# Patient Record
Sex: Male | Born: 1980 | Race: White | Hispanic: No | Marital: Single | Smoking: Never smoker
Health system: Southern US, Community
[De-identification: ages and names within clinical notes are randomized; demographics above are authoritative.]

---

## 2006-03-24 ENCOUNTER — Encounter: Admission: RE | Admit: 2006-03-24 | Discharge: 2006-03-24 | Payer: Self-pay | Admitting: Emergency Medicine

## 2011-10-10 ENCOUNTER — Ambulatory Visit: Payer: PRIVATE HEALTH INSURANCE

## 2011-10-10 ENCOUNTER — Encounter: Payer: Self-pay | Admitting: Physician Assistant

## 2011-10-11 ENCOUNTER — Ambulatory Visit (INDEPENDENT_AMBULATORY_CARE_PROVIDER_SITE_OTHER): Payer: PRIVATE HEALTH INSURANCE | Admitting: Emergency Medicine

## 2011-10-11 VITALS — BP 122/68 | HR 99 | Temp 98.2°F | Resp 16 | Ht 68.78 in | Wt 199.6 lb

## 2011-10-11 DIAGNOSIS — Z202 Contact with and (suspected) exposure to infections with a predominantly sexual mode of transmission: Secondary | ICD-10-CM

## 2011-10-11 DIAGNOSIS — G4700 Insomnia, unspecified: Secondary | ICD-10-CM

## 2011-10-11 DIAGNOSIS — Z7251 High risk heterosexual behavior: Secondary | ICD-10-CM

## 2011-10-11 MED ORDER — TEMAZEPAM 30 MG PO CAPS: 30.0000 mg | ORAL_CAPSULE | Freq: Every evening | ORAL | Status: DC | PRN

## 2011-10-11 NOTE — Progress Notes (Signed)
  Subjective:    Patient ID: Julian Mccarthy, male    DOB: [birth year], 30 y.o.   MRN: [id number]  HPI Ex fiancee under treatment for herpes.  Requests a work up to ensure that he is not infected as she was not faithful.  Denies being symptomatic    Review of Systems As per HPI, otherwise negative.      Objective:   Physical Exam  GEN: WDWN, NAD, Non-toxic, Alert & Oriented x 3 HEENT: Atraumatic, Normocephalic.  Ears and Nose: No external deformity. EXTR: No clubbing/cyanosis/edema NEURO: Normal gait.  PSYCH: Normally interactive. Conversant. Not depressed or anxious appearing.  Calm demeanor.  Genitalia:  Normal male       Assessment & Plan:  Insomnia Exposure to std Follow up as needed

## 2011-10-12 ENCOUNTER — Encounter: Payer: Self-pay | Admitting: Emergency Medicine

## 2011-10-12 LAB — HSV(HERPES SIMPLEX VRS) I + II AB-IGG
HSV 1 Glycoprotein G Ab, IgG: 10.36 IV — ABNORMAL HIGH
HSV 2 Glycoprotein G Ab, IgG: 0.11 IV

## 2011-10-12 LAB — GC/CHLAMYDIA PROBE AMP, URINE
Chlamydia, Swab/Urine, PCR: NEGATIVE
GC Probe Amp, Urine: NEGATIVE

## 2011-10-12 LAB — RPR

## 2011-10-15 ENCOUNTER — Telehealth: Payer: Self-pay

## 2011-10-15 NOTE — Telephone Encounter (Signed)
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 He would like more information about it and about treatment.  Best [phone] (he gave me this number-in the computer it is [phone] I don't know which is correct)

## 2011-10-15 NOTE — Telephone Encounter (Signed)
Spoke to patient. Printed off handout from [place] and we discussed it. I will mail him the handout. He was satisfied with the answers.

## 2011-10-20 ENCOUNTER — Encounter: Payer: Self-pay | Admitting: Family Medicine

## 2012-05-14 ENCOUNTER — Ambulatory Visit: Payer: Self-pay | Admitting: Physician Assistant

## 2012-05-14 VITALS — BP 130/80 | HR 60 | Temp 98.8°F | Resp 16 | Ht 69.5 in | Wt 194.8 lb

## 2012-05-14 DIAGNOSIS — N489 Disorder of penis, unspecified: Secondary | ICD-10-CM

## 2012-05-14 DIAGNOSIS — J019 Acute sinusitis, unspecified: Secondary | ICD-10-CM

## 2012-05-14 DIAGNOSIS — J310 Chronic rhinitis: Secondary | ICD-10-CM

## 2012-05-14 DIAGNOSIS — R05 Cough: Secondary | ICD-10-CM

## 2012-05-14 DIAGNOSIS — T485X5A Adverse effect of other anti-common-cold drugs, initial encounter: Secondary | ICD-10-CM

## 2012-05-14 DIAGNOSIS — R21 Rash and other nonspecific skin eruption: Secondary | ICD-10-CM

## 2012-05-14 LAB — POCT URINALYSIS DIPSTICK
Bilirubin, UA: NEGATIVE
Glucose, UA: NEGATIVE
Ketones, UA: NEGATIVE
Leukocytes, UA: NEGATIVE
Nitrite, UA: NEGATIVE
Urobilinogen, UA: 0.2

## 2012-05-14 LAB — POCT UA - MICROSCOPIC ONLY
Bacteria, U Microscopic: NEGATIVE
Casts, Ur, LPF, POC: NEGATIVE
Crystals, Ur, HPF, POC: NEGATIVE
RBC, urine, microscopic: NEGATIVE

## 2012-05-14 MED ORDER — ALBUTEROL SULFATE HFA 108 (90 BASE) MCG/ACT IN AERS: 2.0000 | INHALATION_SPRAY | RESPIRATORY_TRACT | Status: DC | PRN

## 2012-05-14 MED ORDER — HYDROCODONE-HOMATROPINE 5-1.5 MG/5ML PO SYRP: ORAL_SOLUTION | ORAL | Status: DC

## 2012-05-14 MED ORDER — LEVOFLOXACIN 500 MG PO TABS: 500.0000 mg | ORAL_TABLET | Freq: Every day | ORAL | Status: DC

## 2012-05-14 NOTE — Progress Notes (Signed)
Patient ID: SOLLIE VULTAGGIO MRN: [id number], DOB: 1980/12/06, 32 y.o. Date of Encounter: 05/14/2012, 8:24 PM  Primary Physician: No primary provider on file.  Chief Complaint: Sinusitis and STD check  HPI: 32 y.o. year old male with history below presents with two issues.  1) Patient presents with 2 week history of worsening nasal congestion, sinus pressure, post nasal drip, and one day history of cough. Subjective fever, chills, and myalgias the previous night and today. Cough began the previous night and has been nonproductive. He feels like his chest is tight. No chest pain, wheezing, dyspnea, or shortness of breath. Now with pain over his upper teeth. Normal appetite. Has been using a [employer] and Afrin daily to twice daily for the past week. States that he does not tolerate steroids.   2) He also requests an STD check. Intercourse with women. 3 new partners within the past 12 months. Used condoms for all but the past partner. Reports that his last partner told him she did not have any STD's. He has history of HSV 1. No known lesions. He reports having "a pimple" on the underside of his penis for a month or two without change. No dysuria, frequency, urgency, or discharge.     History reviewed. No pertinent past medical history.   Home Meds: Prior to Admission medications   Medication Sig Start Date End Date Taking? Authorizing Provider  Multiple Vitamin (MULTIVITAMIN) tablet Take 1 tablet by mouth daily.   Yes Historical Provider, MD  oxymetazoline (AFRIN) 0.05 % nasal spray Place 2 sprays into the nose 2 (two) times daily.   Yes Historical Provider, MD  temazepam (RESTORIL) 30 MG capsule Take 1 capsule (30 mg total) by mouth at bedtime as needed for sleep. 10/11/11 11/10/11  Phillips Odor, MD    Allergies:  Allergies  Allergen Reactions  . Flonase (Fluticasone Propionate) Other (See Comments)    History   Social History  . Marital Status: Single    Spouse Name: N/A   Number of Children: N/A  . Years of Education: N/A   Occupational History  . Not on file.   Social History Main Topics  . Smoking status: Never Smoker   . Smokeless tobacco: Not on file  . Alcohol Use: Not on file  . Drug Use: Not on file  . Sexually Active: Not on file   Other Topics Concern  . Not on file   Social History Narrative  . No narrative on file     Review of Systems: Constitutional: positive for fever, chills, myalgias, and fatigue. negative for night sweats, or weight changes  HEENT: see above Cardiovascular: negative for chest pain, orthopnea, DOE, or palpitations Respiratory: positive for cough. negative for hemoptysis, wheezing, or shortness of breath Abdominal: negative for abdominal pain, nausea, vomiting, diarrhea, or constipation Genitourinary: see above Dermatological: negative for rash Neurologic: negative for headache, dizziness, or syncope   Physical Exam: Blood pressure 130/80, pulse 60, temperature 98.8 F (37.1 C), temperature source Oral, resp. rate 16, height 5' 9.5" (1.765 m), weight 194 lb 12.8 oz (88.361 kg), SpO2 98.00%., Body mass index is 28.36 kg/(m^2). General: Well developed, well nourished, in no acute distress. Head: Normocephalic, atraumatic, eyes without discharge, sclera non-icteric, nares are without discharge. Bilateral auditory canals clear, TM's are without perforation, pearly grey and translucent with reflective cone of light bilaterally. Palpation of the maxillary sinuses relieves pressure bilaterally. Oral cavity moist, posterior pharynx with post nasal drip. No exudate, erythema, or peritonsillar abscess. Uvula  midline.   Neck: Supple. No thyromegaly. Full ROM. No lymphadenopathy. Lungs: Clear bilaterally to auscultation without wheezes, rales, or rhonchi. Breathing is unlabored. Heart: RRR with S1 S2. No murmurs, rubs, or gallops appreciated. Abdomen: Soft, non-tender, non-distended with normoactive bowel sounds. No  hepatosplenomegaly. No rebound/guarding. No obvious abdominal masses. Genitourinary: No inguinal hernia bilaterally. No testicular pain, mass, or epididymal TTP. Pinpoint hypopigmented lesion along the dorsal base of the carina. This does not resemble a herpetic lesion or a vesicular lesion. Has the appearance of a mildly calcified folliculitis.   Msk:  Strength and tone normal for age. Extremities/Skin: Warm and dry. No clubbing or cyanosis. No edema. No rashes or suspicious lesions. Neuro: Alert and oriented X 3. Moves all extremities spontaneously. Gait is normal. CNII-XII grossly in tact. Psych:  Responds to questions appropriately with a normal affect.   Labs: Results for orders placed in visit on 05/14/12  POCT UA - MICROSCOPIC ONLY      Result Value Range   WBC, Ur, HPF, POC neg     RBC, urine, microscopic neg     Bacteria, U Microscopic neg     Mucus, UA neg     Epithelial cells, urine per micros neg     Crystals, Ur, HPF, POC neg     Casts, Ur, LPF, POC neg     Yeast, UA neg    POCT URINALYSIS DIPSTICK      Result Value Range   Color, UA yellow     Clarity, UA clear     Glucose, UA neg     Bilirubin, UA neg     Ketones, UA neg     Spec Grav, UA 1.010     Blood, UA neg     pH, UA 7.0     Protein, UA neg     Urobilinogen, UA 0.2     Nitrite, UA neg     Leukocytes, UA Negative      HIV, RPR, HSV 2, Uriprobe all pending  ASSESSMENT AND PLAN:  32 y.o. male with sinusitis and here for STD check  1) Sinusitis/cough/rhinitis medicamentosa  -Levaquin 500 mg 1 po daily #10 no RF -Proventil 2 puffs inhaled q 4-6 hours prn #1 no RF -Hycodan #4oz 1 tsp po q 4-6 hours prn cough no RF SED -Taper Afrin usage -Mucinex -Rest/fluids  2) STD check -Await labs -Safe sex practices   Elinor Dodge, Cordelia Poche 05/14/2012 8:24 PM

## 2012-05-15 ENCOUNTER — Telehealth: Payer: Self-pay

## 2012-05-15 DIAGNOSIS — R05 Cough: Secondary | ICD-10-CM

## 2012-05-15 DIAGNOSIS — R059 Cough, unspecified: Secondary | ICD-10-CM

## 2012-05-15 DIAGNOSIS — J019 Acute sinusitis, unspecified: Secondary | ICD-10-CM

## 2012-05-15 NOTE — Telephone Encounter (Signed)
Can you advise on a more cost effective antibiotic?

## 2012-05-15 NOTE — Telephone Encounter (Signed)
levofloxacin (LEVAQUIN) 500 MG tablet albuterol (PROVENTIL HFA;VENTOLIN HFA) 108 (90 BASE) MCG/ACT inhaler  Patient does not have insurance and the meds are too expensive Please send to [facility] on [address] Do you have coupons?     [phone]

## 2012-05-16 ENCOUNTER — Other Ambulatory Visit: Payer: Self-pay

## 2012-05-16 DIAGNOSIS — R21 Rash and other nonspecific skin eruption: Secondary | ICD-10-CM

## 2012-05-16 DIAGNOSIS — J019 Acute sinusitis, unspecified: Secondary | ICD-10-CM

## 2012-05-16 LAB — GC/CHLAMYDIA PROBE AMP, URINE
Chlamydia, Swab/Urine, PCR: NEGATIVE
GC Probe Amp, Urine: NEGATIVE

## 2012-05-16 MED ORDER — LEVOFLOXACIN 500 MG PO TABS: 500.0000 mg | ORAL_TABLET | Freq: Every day | ORAL | Status: DC

## 2012-05-16 MED ORDER — ALBUTEROL SULFATE HFA 108 (90 BASE) MCG/ACT IN AERS: 2.0000 | INHALATION_SPRAY | RESPIRATORY_TRACT | Status: DC | PRN

## 2012-05-16 MED ORDER — AMOXICILLIN-POT CLAVULANATE 875-125 MG PO TABS: 1.0000 | ORAL_TABLET | Freq: Two times a day (BID) | ORAL | Status: DC

## 2012-05-16 NOTE — Telephone Encounter (Signed)
Please call patient. Medication changed.

## 2012-05-17 NOTE — Telephone Encounter (Signed)
Thank you patient has been advised.

## 2013-11-28 ENCOUNTER — Ambulatory Visit (INDEPENDENT_AMBULATORY_CARE_PROVIDER_SITE_OTHER): Payer: No Typology Code available for payment source

## 2013-11-28 ENCOUNTER — Ambulatory Visit (INDEPENDENT_AMBULATORY_CARE_PROVIDER_SITE_OTHER): Payer: No Typology Code available for payment source | Admitting: Family Medicine

## 2013-11-28 DIAGNOSIS — M25511 Pain in right shoulder: Secondary | ICD-10-CM

## 2013-11-28 DIAGNOSIS — M25519 Pain in unspecified shoulder: Secondary | ICD-10-CM

## 2013-11-28 DIAGNOSIS — R519 Headache, unspecified: Secondary | ICD-10-CM

## 2013-11-28 DIAGNOSIS — M542 Cervicalgia: Secondary | ICD-10-CM

## 2013-11-28 DIAGNOSIS — R209 Unspecified disturbances of skin sensation: Secondary | ICD-10-CM

## 2013-11-28 DIAGNOSIS — R208 Other disturbances of skin sensation: Secondary | ICD-10-CM

## 2013-11-28 DIAGNOSIS — M62838 Other muscle spasm: Secondary | ICD-10-CM

## 2013-11-28 DIAGNOSIS — R51 Headache: Secondary | ICD-10-CM

## 2013-11-28 MED ORDER — CYCLOBENZAPRINE HCL 5 MG PO TABS: ORAL_TABLET | ORAL | Status: DC
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
## 2014-04-30 ENCOUNTER — Ambulatory Visit (INDEPENDENT_AMBULATORY_CARE_PROVIDER_SITE_OTHER): Payer: 59 | Admitting: Family Medicine

## 2014-04-30 VITALS — BP 138/84 | HR 84 | Temp 98.1°F | Resp 16 | Ht 70.0 in | Wt 216.4 lb

## 2014-04-30 DIAGNOSIS — R21 Rash and other nonspecific skin eruption: Secondary | ICD-10-CM

## 2014-04-30 LAB — RPR

## 2014-04-30 LAB — HIV ANTIBODY (ROUTINE TESTING W REFLEX): HIV 1&2 Ab, 4th Generation: NONREACTIVE

## 2014-04-30 NOTE — Progress Notes (Signed)
 @[facility]GO @  This chart was scribed for Julian SidleKurt Lauenstein, MD by Julian Mccarthy, ED Scribe. The patient was seen in room 14. Patient's care was started at 10:54 AM.  Patient ID: Julian Mccarthy MRN: [id number], DOB: 05/21/1980, 34 y.o. Date of Encounter: 04/30/2014, 10:54 AM  Primary Physician: No PCP Per Patient  Chief Complaint  Patient presents with   Tenderness tip of penis    Onset 50month off and on   Would like testing for all STD's   HPI: 34 y.o. year old male with history below presents with irritation and tenderness to the tip of his penis. He is here requesting routine testing for STDs. He states that he had a long term relationship end about 6 months ago, and since then he has had 4 sexual partners, which is abnormal for him. He denies having sexual intercourse for the past two weeks. He denies penile discharge. He works as an Pensions consultantattorney.    No past medical history on file.   Home Meds: Prior to Admission medications   Medication Sig Start Date End Date Taking? Authorizing Provider  Multiple Vitamin (MULTIVITAMIN) tablet Take 1 tablet by mouth daily.    Historical Provider, MD    Allergies:  Allergies  Allergen Reactions   Flonase [Fluticasone Propionate] Other (See Comments)    History   Social History   Marital Status: Single    Spouse Name: N/A   Number of Children: N/A   Years of Education: N/A   Occupational History   Not on file.   Social History Main Topics   Smoking status: Never Smoker    Smokeless tobacco: Never Used   Alcohol Use: Yes     Comment: social   Drug Use: No   Sexual Activity: Not on file   Other Topics Concern   Not on file   Social History Narrative     Review of Systems: Constitutional: negative for chills, fever, night sweats, weight changes, or fatigue  HEENT: negative for vision changes, hearing loss, congestion, rhinorrhea, ST, epistaxis, or sinus pressure Cardiovascular: negative for chest pain or  palpitations Respiratory: negative for hemoptysis, wheezing, shortness of breath, or cough Abdominal: negative for abdominal pain, nausea, vomiting, diarrhea, or constipation Dermatological: negative for rash Neurologic: negative for headache, dizziness, or syncope GU: negative for discharge Skin: rash All other systems reviewed and are otherwise negative with the exception to those above and in the HPI.   Physical Exam: Blood pressure 138/84, pulse 84, temperature 98.1 F (36.7 C), temperature source Oral, resp. rate 16, height 5\' 10"  (1.778 m), weight 216 lb 6.4 oz (98.158 kg), SpO2 98 %., Body mass index is 31.05 kg/(m^2). General: Well developed, well nourished, in no acute distress. Head: Normocephalic, atraumatic, eyes without discharge, sclera non-icteric, nares are without discharge. Bilateral auditory canals clear, TM's are without perforation, pearly grey and translucent with reflective cone of light bilaterally. Oral cavity moist, posterior pharynx without exudate, erythema, peritonsillar abscess, or post nasal drip.  Neck: Supple. No thyromegaly. Full ROM. No lymphadenopathy. Lungs: Clear bilaterally to auscultation without wheezes, rales, or rhonchi. Breathing is unlabored. Heart: RRR with S1 S2. No murmurs, rubs, or gallops appreciated. Abdomen: Soft, non-tender, non-distended with normoactive bowel sounds. No hepatomegaly. No rebound/guarding. No obvious abdominal masses. Msk:  Strength and tone normal for age. Extremities/Skin: Warm and dry. No clubbing or cyanosis. No edema. No rashes or suspicious lesions. Neuro: Alert and oriented X 3. Moves all extremities spontaneously. Gait is normal. CNII-XII grossly in tact. Psych:  Responds to questions appropriately with a normal affect.  Genitalia:  Normal with the exception of very slight erythema at meatus  ASSESSMENT AND PLAN:  34 y.o. year old male with protected heterosexual encounters who has some irritation at the meatus  and wants to be sure this is not an STD This chart was scribed in my presence and reviewed by me personally.    ICD-9-CM ICD-10-CM   1. Rash and nonspecific skin eruption 782.1 R21 GC/Chlamydia Probe Amp     RPR     HSV(herpes simplex vrs) 1+2 ab-IgG     HIV antibody     Signed, Julian Sidle, MD    Signed, Julian Sidle, MD 04/30/2014 10:55 AM

## 2014-05-01 LAB — GC/CHLAMYDIA PROBE AMP
CT Probe RNA: NEGATIVE
GC Probe RNA: NEGATIVE

## 2014-05-01 LAB — HSV(HERPES SIMPLEX VRS) I + II AB-IGG
HSV 1 Glycoprotein G Ab, IgG: 10.27 IV — ABNORMAL HIGH
HSV 2 Glycoprotein G Ab, IgG: <0.1 IV

## 2014-07-03 ENCOUNTER — Ambulatory Visit (INDEPENDENT_AMBULATORY_CARE_PROVIDER_SITE_OTHER): Payer: 59 | Admitting: Family Medicine

## 2014-07-03 ENCOUNTER — Encounter: Payer: Self-pay | Admitting: Physician Assistant

## 2014-07-03 VITALS — BP 130/80 | HR 74 | Temp 98.8°F | Resp 16 | Ht 70.0 in | Wt 216.4 lb

## 2014-07-03 DIAGNOSIS — J0100 Acute maxillary sinusitis, unspecified: Secondary | ICD-10-CM

## 2014-07-03 MED ORDER — AMOXICILLIN-POT CLAVULANATE 875-125 MG PO TABS: 1.0000 | ORAL_TABLET | Freq: Two times a day (BID) | ORAL | Status: DC

## 2014-07-03 NOTE — Patient Instructions (Signed)

## 2014-07-03 NOTE — Progress Notes (Signed)
    Patient ID: Julian Mccarthy MRN: [id number], DOB: 10/17/1980, 34 y.o. Date of Encounter: 07/03/2014, 4:43 PM  Primary Physician: No PCP Per Patient  Chief Complaint:  Chief Complaint  Patient presents with  . pressure    eyes, head, ears x 1 month  . mucus    nasal green "snot"  . Sore Throat    in the am    HPI: 34 y.o. year old male presents with 28 day history of nasal congestion, post nasal drip, sore throat, sinus pressure, and cough. Afebrile. No chills. Nasal congestion thick and green/yellow. Sinus pressure is the worst symptom. Cough is productive secondary to post nasal drip and not associated with time of day. Ears feel full, leading to sensation of muffled hearing. Has tried OTC cold preps without success. No GI complaints.   No recent antibiotics, recent travels, vomiting, or sick contacts   No leg trauma, sedentary periods, h/o cancer, or tobacco use.  No past medical history on file.   Home Meds: Prior to Admission medications   Medication Sig Start Date End Date Taking? Authorizing Provider  Multiple Vitamin (MULTIVITAMIN) tablet Take 1 tablet by mouth daily.    Historical Provider, MD    Allergies:  Allergies  Allergen Reactions  . Flonase [Fluticasone Propionate] Other (See Comments)    History   Social History  . Marital Status: Single    Spouse Name: N/A  . Number of Children: N/A  . Years of Education: N/A   Occupational History  . Not on file.   Social History Main Topics  . Smoking status: Never Smoker   . Smokeless tobacco: Never Used  . Alcohol Use: Yes     Comment: social  . Drug Use: No  . Sexual Activity: Not on file   Other Topics Concern  . Not on file   Social History Narrative     Review of Systems:= Constitutional: negative for chills, fever, night sweats or weight changes Cardiovascular: negative for chest pain or palpitations Respiratory: negative for hemoptysis, wheezing, or shortness of breath Abdominal: negative  for abdominal pain, nausea, vomiting or diarrhea Dermatological: negative for rash Neurologic: negative for headache   Physical Exam:  Blood pressure 130/80, pulse 74, temperature 98.8 F (37.1 C), temperature source Oral, resp. rate 16, height 5\' 10"  (1.778 m), weight 216 lb 6.4 oz (98.158 kg), SpO2 98 %., Body mass index is 31.05 kg/(m^2). General: Well developed, well nourished, in no acute distress. Head: Normocephalic, atraumatic, eyes without discharge, sclera non-icteric, nares are congested. Bilateral auditory canals clear, TM's are without perforation, pearly grey with reflective cone of light bilaterally. Serous effusion bilaterally behind TM's. Maxillary sinus TTP. Oral cavity moist, dentition normal. Posterior pharynx with post nasal drip and mild erythema. No peritonsillar abscess or tonsillar exudate. Neck: Supple. No thyromegaly. Full ROM. No lymphadenopathy. Lungs: Clear bilaterally to auscultation without wheezes, rales, or rhonchi. Breathing is unlabored.  Heart: RRR with S1 S2. No murmurs, rubs, or gallops appreciated. Msk:  Strength and tone normal for age. Extremities: No clubbing or cyanosis. No edema. Neuro: Alert and oriented X 3. Moves all extremities spontaneously. CNII-XII grossly in tact. Psych:  Responds to questions appropriately with a normal affect.     ASSESSMENT AND PLAN:  34 y.o. year old male with sinusitis  Augmentin 875 mg bid x 10 days. -  -Tylenol/Motrin prn -Rest/fluids -RTC precautions -RTC 3-5 days if no improvement  Signed, Elvina SidleKurt Jyssica Rief, MD 07/03/2014 4:43 PM

## 2014-11-09 ENCOUNTER — Ambulatory Visit (INDEPENDENT_AMBULATORY_CARE_PROVIDER_SITE_OTHER): Payer: 59 | Admitting: Internal Medicine

## 2014-11-09 VITALS — BP 122/80 | HR 88 | Temp 99.2°F | Resp 16 | Ht 69.5 in | Wt 218.0 lb

## 2014-11-09 DIAGNOSIS — R059 Cough, unspecified: Secondary | ICD-10-CM

## 2014-11-09 DIAGNOSIS — J0100 Acute maxillary sinusitis, unspecified: Secondary | ICD-10-CM | POA: Diagnosis not present

## 2014-11-09 DIAGNOSIS — R05 Cough: Secondary | ICD-10-CM

## 2014-11-09 MED ORDER — HYDROCODONE-HOMATROPINE 5-1.5 MG/5ML PO SYRP: 5.0000 mL | ORAL_SOLUTION | Freq: Four times a day (QID) | ORAL | Status: DC | PRN

## 2014-11-09 MED ORDER — AMOXICILLIN 875 MG PO TABS: 875.0000 mg | ORAL_TABLET | Freq: Two times a day (BID) | ORAL | Status: DC

## 2014-11-09 NOTE — Progress Notes (Signed)
   Subjective:  This chart was scribed for Julian Sia, MD by [facility], medical scribe at [facility].The patient was seen in exam room 02 and the patient's care was started at 3:30 PM.   Patient ID: Julian Mccarthy, male    DOB: [birth year], 34 y.o.   MRN: [id number] Chief Complaint  Patient presents with  . Nasal Congestion    clear mucus/ x 3 days  . Cough    x 3 days  . Sinusitis    x 3 days  . Ear Pain   HPI HPI Comments: Julian Mccarthy is a 34 y.o. male who presents to [facility] complaining of non productive cough, nasal congestion, and rhinorrhea for the past three days. Green nasal discharge in the morning, and clear during the day. Also has had a subjective fever, bilateral ear pain, diarrhea and nausea. Taking imodium, and tried claritin.  History reviewed. No pertinent past medical history. Prior to Admission medications   Medication Sig Start Date End Date Taking? Authorizing Provider  Multiple Vitamin (MULTIVITAMIN) tablet Take 1 tablet by mouth daily.   Yes Historical Provider, MD  amoxicillin-clavulanate (AUGMENTIN) 875-125 MG per tablet Take 1 tablet by mouth 2 (two) times daily. Patient not taking: Reported on 11/09/2014 07/03/14   Elvina Sidle, MD   Allergies  Allergen Reactions  . Flonase [Fluticasone Propionate] Other (See Comments)   Review of Systems  Constitutional: Positive for fever.  HENT: Positive for congestion, ear pain and rhinorrhea.   Respiratory: Positive for cough.   Gastrointestinal: Positive for nausea and diarrhea.      Objective:  BP 122/80 mmHg  Pulse 88  Temp(Src) 99.2 F (37.3 C) (Oral)  Resp 16  Ht 5' 9.5" (1.765 m)  Wt 218 lb (98.884 kg)  BMI 31.74 kg/m2  SpO2 98% Physical Exam  Constitutional: He is oriented to person, place, and time. He appears well-developed and well-nourished. No distress.  HENT:  Head: Normocephalic and atraumatic.  Right Ear: External ear normal.  Left  Ear: External ear normal.  Mouth/Throat: Oropharynx is clear and moist.  Purulent nasal discharge with tender maxillary areas  Eyes: Pupils are equal, round, and reactive to light.  Neck: Normal range of motion.  Cardiovascular: Normal rate, regular rhythm and normal heart sounds.   Pulmonary/Chest: Effort normal and breath sounds normal. No respiratory distress.  Musculoskeletal: Normal range of motion.  Lymphadenopathy:    He has no cervical adenopathy.  Neurological: He is alert and oriented to person, place, and time.  Skin: Skin is warm and dry.  Psychiatric: He has a normal mood and affect. His behavior is normal.  Nursing note and vitals reviewed.     Assessment & Plan:  Acute maxillary sinusitis, recurrence not specified  Cough  Meds ordered this encounter  Medications  . amoxicillin (AMOXIL) 875 MG tablet    Sig: Take 1 tablet (875 mg total) by mouth 2 (two) times daily.    Dispense:  20 tablet    Refill:  0  . HYDROcodone-homatropine (HYCODAN) 5-1.5 MG/5ML syrup    Sig: Take 5 mLs by mouth every 6 (six) hours as needed.    Dispense:  120 mL    Refill:  0      I have completed the patient encounter in its entirety as documented by the scribe, with editing by me where necessary. Shakenna Herrero P. Merla Riches, M.D.

## 2015-01-15 ENCOUNTER — Ambulatory Visit (INDEPENDENT_AMBULATORY_CARE_PROVIDER_SITE_OTHER): Payer: 59 | Admitting: Urgent Care

## 2015-01-15 ENCOUNTER — Ambulatory Visit (INDEPENDENT_AMBULATORY_CARE_PROVIDER_SITE_OTHER): Payer: 59

## 2015-01-15 ENCOUNTER — Encounter: Payer: Self-pay | Admitting: Urgent Care

## 2015-01-15 VITALS — BP 120/82 | HR 66 | Temp 98.2°F | Resp 16 | Ht 70.0 in | Wt 222.0 lb

## 2015-01-15 DIAGNOSIS — M79672 Pain in left foot: Secondary | ICD-10-CM

## 2015-01-15 DIAGNOSIS — Z23 Encounter for immunization: Secondary | ICD-10-CM | POA: Diagnosis not present

## 2015-01-15 DIAGNOSIS — J069 Acute upper respiratory infection, unspecified: Secondary | ICD-10-CM

## 2015-01-15 DIAGNOSIS — R05 Cough: Secondary | ICD-10-CM

## 2015-01-15 DIAGNOSIS — R0981 Nasal congestion: Secondary | ICD-10-CM

## 2015-01-15 DIAGNOSIS — J029 Acute pharyngitis, unspecified: Secondary | ICD-10-CM

## 2015-01-15 DIAGNOSIS — R059 Cough, unspecified: Secondary | ICD-10-CM

## 2015-01-15 MED ORDER — PSEUDOEPHEDRINE HCL ER 120 MG PO TB12: 120.0000 mg | ORAL_TABLET | Freq: Two times a day (BID) | ORAL | Status: DC

## 2015-01-15 MED ORDER — CETIRIZINE HCL 10 MG PO TABS: 10.0000 mg | ORAL_TABLET | Freq: Every day | ORAL | Status: DC

## 2015-01-15 MED ORDER — NAPROXEN SODIUM 550 MG PO TABS: 550.0000 mg | ORAL_TABLET | Freq: Two times a day (BID) | ORAL | Status: DC

## 2015-01-15 MED ORDER — BENZONATATE 100 MG PO CAPS: 100.0000 mg | ORAL_CAPSULE | Freq: Three times a day (TID) | ORAL | Status: DC | PRN

## 2015-01-15 MED ORDER — HYDROCODONE-HOMATROPINE 5-1.5 MG/5ML PO SYRP: 5.0000 mL | ORAL_SOLUTION | Freq: Every evening | ORAL | Status: DC | PRN

## 2015-01-15 NOTE — Progress Notes (Signed)
MRN: [id number] DOB: 08-06-1980  Subjective:   Julian Mccarthy is a 34 y.o. male presenting for chief complaint of Facial Pain; Cough; Sore Throat; and Foot Pain  Sinus - reports 1 week history of sinus congestion, sinus pressure/pain, ear popping, sore throat. Has had night sweats x2 and productive cough, worse at bed time and when he wakes up. Cough has made it difficult to get to sleep. Has tried Mucinex intermittently. Of note, patient admits that he feels "10x" better today than yesterday. Has had 1 sick contact 1-2 weeks ago. Denies fever, ear pain, ear drainage, tooth pain, chest pain, shob, wheezing, n/v, abdominal pain, myalgia. Denies history of seasonal allergies or asthma. Denies smoking cigarettes.   Foot pain - reports 1 week history of left pain over lateral side. Pain is elicited when he bends at the ball of his foot, such as with walking. Denies trauma, redness, swelling, increased activity, bony deformity, popping sensations. Has tried Alleve for back pain which has helped some with his foot pain. Patient is an Pensions consultant, sedentary job.  Denies any other aggravating or relieving factors, no other questions or concerns.  Julian Mccarthy has a current medication list which includes the following prescription(s): multivitamin. Also is allergic to flonase.  Julian Mccarthy  has no past medical history on file. Also  has no past surgical history on file.  Objective:   Vitals: BP 120/82 mmHg  Pulse 66  Temp(Src) 98.2 F (36.8 C) (Oral)  Resp 16  Ht  (1.778 m)  Wt 222 lb (100.699 kg)  BMI 31.85 kg/m2  SpO2 97%  Physical Exam  Constitutional: He is oriented to person, place, and time. He appears well-developed and well-nourished.  HENT:  TM's intact bilaterally, no effusions or erythema. Nasal turbinates pink and moist with minimal mucus. Mild maxillary sinus tenderness. Postnasal drip present, without oropharyngeal exudates, erythema or abscesses.  Eyes: Right eye exhibits no  discharge. Left eye exhibits no discharge. No scleral icterus.  Neck: Normal range of motion. Neck supple.  Cardiovascular: Normal rate, regular rhythm and intact distal pulses.  Exam reveals no gallop and no friction rub.   No murmur heard. Pulmonary/Chest: No respiratory distress. He has no wheezes. He has no rales.  Musculoskeletal:       Left foot: There is tenderness (over depicted area, plantar surface > dorsal surface) and bony tenderness. There is normal range of motion, no swelling, normal capillary refill, no crepitus, no deformity and no laceration.       Feet:  Strength 5/5, sensation intact.  Lymphadenopathy:    He has no cervical adenopathy.  Neurological: He is alert and oriented to person, place, and time.  Skin: Skin is warm and dry. No rash noted. No erythema. No pallor.  Psychiatric: He has a normal mood and affect.   [facility] reading (PRIMARY) by  Dr. Neva Seat and PA-Maryruth Apple. Left foot - normal.  Assessment and Plan :   1. Upper respiratory infection 2. Cough 3. Sore throat 4. Nasal congestion - Likely undergoing tail end of viral URI. Offered symptomatic relief, if symptoms persist or return in 4-5 days, consider antibiotic therapy.  5. Left foot pain - X-ray and physical exam findings reassuring - Unclear etiology, patient's symptoms are mild currently. I will manage this conservatively with NSAID, recheck in 2 weeks. Patient declined bloodwork today. Consider physical therapy of referral to ortho.  6. Need for Tdap vaccination - Tdap vaccine greater than or equal to 7yo IM   Julian Bamberg,  PA-C [facility] [phone] 01/15/2015 3:27 PM

## 2015-01-15 NOTE — Patient Instructions (Signed)
Upper Respiratory Infection, Adult Most upper respiratory infections (URIs) are a viral infection of the air passages leading to the lungs. A URI affects the nose, throat, and upper air passages. The most common type of URI is nasopharyngitis and is typically referred to as "the common cold." URIs run their course and usually go away on their own. Most of the time, a URI does not require medical attention, but sometimes a bacterial infection in the upper airways can follow a viral infection. This is called a secondary infection. Sinus and middle ear infections are common types of secondary upper respiratory infections. Bacterial pneumonia can also complicate a URI. A URI can worsen asthma and chronic obstructive pulmonary disease (COPD). Sometimes, these complications can require emergency medical care and may be life threatening.  CAUSES Almost all URIs are caused by viruses. A virus is a type of germ and can spread from one person to another.  RISKS FACTORS You may be at risk for a URI if:   You smoke.   You have chronic heart or lung disease.  You have a weakened defense (immune) system.   You are very young or very old.   You have nasal allergies or asthma.  You work in crowded or poorly ventilated areas.  You work in health care facilities or schools. SIGNS AND SYMPTOMS  Symptoms typically develop 2-3 days after you come in contact with a cold virus. Most viral URIs last 7-10 days. However, viral URIs from the influenza virus (flu virus) can last 14-18 days and are typically more severe. Symptoms may include:   Runny or stuffy (congested) nose.   Sneezing.   Cough.   Sore throat.   Headache.   Fatigue.   Fever.   Loss of appetite.   Pain in your forehead, behind your eyes, and over your cheekbones (sinus pain).  Muscle aches.  DIAGNOSIS  Your health care provider may diagnose a URI by:  Physical exam.  Tests to check that your symptoms are not due to  another condition such as:  Strep throat.  Sinusitis.  Pneumonia.  Asthma. TREATMENT  A URI goes away on its own with time. It cannot be cured with medicines, but medicines may be prescribed or recommended to relieve symptoms. Medicines may help:  Reduce your fever.  Reduce your cough.  Relieve nasal congestion. HOME CARE INSTRUCTIONS   Take medicines only as directed by your health care provider.   Gargle warm saltwater or take cough drops to comfort your throat as directed by your health care provider.  Use a warm mist humidifier or inhale steam from a shower to increase air moisture. This may make it easier to breathe.  Drink enough fluid to keep your urine clear or pale yellow.   Eat soups and other clear broths and maintain good nutrition.   Rest as needed.   Return to work when your temperature has returned to normal or as your health care provider advises. You may need to stay home longer to avoid infecting others. You can also use a face mask and careful hand washing to prevent spread of the virus.  Increase the usage of your inhaler if you have asthma.   Do not use any tobacco products, including cigarettes, chewing tobacco, or electronic cigarettes. If you need help quitting, ask your health care provider. PREVENTION  The best way to protect yourself from getting a cold is to practice good hygiene.   Avoid oral or hand contact with people with cold   symptoms.   Wash your hands often if contact occurs.  There is no clear evidence that vitamin C, vitamin E, echinacea, or exercise reduces the chance of developing a cold. However, it is always recommended to get plenty of rest, exercise, and practice good nutrition.  SEEK MEDICAL CARE IF:   You are getting worse rather than better.   Your symptoms are not controlled by medicine.   You have chills.  You have worsening shortness of breath.  You have brown or red mucus.  You have yellow or brown nasal  discharge.  You have pain in your face, especially when you bend forward.  You have a fever.  You have swollen neck glands.  You have pain while swallowing.  You have white areas in the back of your throat. SEEK IMMEDIATE MEDICAL CARE IF:   You have severe or persistent:  Headache.  Ear pain.  Sinus pain.  Chest pain.  You have chronic lung disease and any of the following:  Wheezing.  Prolonged cough.  Coughing up blood.  A change in your usual mucus.  You have a stiff neck.  You have changes in your:  Vision.  Hearing.  Thinking.  Mood. MAKE SURE YOU:   Understand these instructions.  Will watch your condition.  Will get help right away if you are not doing well or get worse.   This information is not intended to replace advice given to you by your health care provider. Make sure you discuss any questions you have with your health care provider.   Document Released: 08/31/2000 Document Revised: 07/22/2014 Document Reviewed: 06/12/2013 [employer] Interactive Patient Education 2016 [employer]  

## 2015-04-01 ENCOUNTER — Encounter: Payer: Self-pay | Admitting: Family Medicine

## 2015-04-01 ENCOUNTER — Ambulatory Visit (INDEPENDENT_AMBULATORY_CARE_PROVIDER_SITE_OTHER): Payer: BLUE CROSS/BLUE SHIELD | Admitting: Family Medicine

## 2015-04-01 VITALS — BP 114/76 | HR 105 | Temp 100.2°F | Resp 16 | Ht 70.0 in | Wt 219.8 lb

## 2015-04-01 DIAGNOSIS — R05 Cough: Secondary | ICD-10-CM

## 2015-04-01 DIAGNOSIS — J09X2 Influenza due to identified novel influenza A virus with other respiratory manifestations: Secondary | ICD-10-CM | POA: Diagnosis not present

## 2015-04-01 DIAGNOSIS — R059 Cough, unspecified: Secondary | ICD-10-CM

## 2015-04-01 DIAGNOSIS — R509 Fever, unspecified: Secondary | ICD-10-CM

## 2015-04-01 DIAGNOSIS — R5381 Other malaise: Secondary | ICD-10-CM | POA: Diagnosis not present

## 2015-04-01 LAB — POCT INFLUENZA A/B
INFLUENZA B, POC: NEGATIVE
Influenza A, POC: POSITIVE — AB

## 2015-04-01 MED ORDER — DOXYCYCLINE HYCLATE 100 MG PO CAPS: 100.0000 mg | ORAL_CAPSULE | Freq: Two times a day (BID) | ORAL | Status: DC

## 2015-04-01 MED ORDER — OSELTAMIVIR PHOSPHATE 75 MG PO CAPS: 75.0000 mg | ORAL_CAPSULE | Freq: Two times a day (BID) | ORAL | Status: DC

## 2015-04-01 NOTE — Patient Instructions (Signed)
You have the flu. Rest, drink plenty of fluids Stay home from work until you have no fever for 24 hours.  Let me know if you do not start to improve over the next 2-3 days.  However you will probably not feel 100% for at least a week Use the tamiflu twice a day for 5 days You can also use OTC medications as needed for fever and other symptoms  If you are over the flu but still feel like you have a chronic sinus issue, try taking the 10 day course of doxycycline antibiotic that I gave you.  Take this pill with food and water

## 2015-04-01 NOTE — Progress Notes (Signed)
[facility] [address] [place] [address] [phone]  Date:  04/01/2015   Name:  Julian Mccarthy   DOB:  1980/11/09   MRN:  [id number]  PCP:  No PCP Per Patient    Chief Complaint: Nasal Congestion and Fever   History of Present Illness:  Julian Mccarthy is a 35 y.o. very pleasant male patient who presents with the following:  Here today with illness- generally in good health.  He was here a couple of months ago with URI sympoms.  Did not feel that he ever got 100% better from this illness and has had some more persistent sinus drainage and congestion.   However over the last 10 days or so went downhill again. He has used some mucinex.   Last night he again got acutely worse.    He has been running fevers just since last night. At home he got a temp up to 101.   He does have some cough which he thinks is due to PND.   He has noted body aches and chills  He did not sleep well last night- he got worse last night  No GI symptoms He has a scratchy throat He fees very tired today There are no active problems to display for this patient.   No past medical history on file.  No past surgical history on file.  Social History  Substance Use Topics  . Smoking status: Never Smoker   . Smokeless tobacco: Never Used  . Alcohol Use: Yes     Comment: social    Family History  Problem Relation Age of Onset  . Cancer Father   . Stroke Maternal Grandfather     Allergies  Allergen Reactions  . Flonase [Fluticasone Propionate] Other (See Comments)    Medication list has been reviewed and updated.  Current Outpatient Prescriptions on File Prior to Visit  Medication Sig Dispense Refill  . Multiple Vitamin (MULTIVITAMIN) tablet Take 1 tablet by mouth daily.    . benzonatate (TESSALON) 100 MG capsule Take 1-2 capsules (100-200 mg total) by mouth 3 (three) times daily as needed for cough. (Patient not taking: Reported on 04/01/2015) 40 capsule 0  . cetirizine  (ZYRTEC) 10 MG tablet Take 1 tablet (10 mg total) by mouth daily. (Patient not taking: Reported on 04/01/2015) 30 tablet 6  . HYDROcodone-homatropine (HYCODAN) 5-1.5 MG/5ML syrup Take 5 mLs by mouth at bedtime as needed. (Patient not taking: Reported on 04/01/2015) 120 mL 0  . naproxen sodium (ANAPROX DS) 550 MG tablet Take 1 tablet (550 mg total) by mouth 2 (two) times daily with a meal. (Patient not taking: Reported on 04/01/2015) 14 tablet 1  . pseudoephedrine (SUDAFED 12 HOUR) 120 MG 12 hr tablet Take 1 tablet (120 mg total) by mouth 2 (two) times daily. (Patient not taking: Reported on 04/01/2015) 20 tablet 0   No current facility-administered medications on file prior to visit.    Review of Systems:  As per HPI- otherwise negative.   Physical Examination: Filed Vitals:   04/01/15 1015  BP: 114/76  Pulse: 105  Temp: 100.2 F (37.9 C)  Resp: 16   Filed Vitals:   04/01/15 1015  Height: 5\' 10"  (1.778 m)  Weight: 219 lb 12.8 oz (99.701 kg)   Body mass index is 31.54 kg/(m^2). Ideal Body Weight: Weight in (lb) to have BMI = 25: 173.9  GEN: WDWN, NAD, Non-toxic, A & O x 3, does not appear to feel  well HEENT: Atraumatic, Normocephalic. Neck supple. No masses, No LAD.  Bilateral TM wnl, oropharynx normal.  PEERL,EOMI.   Ears and Nose: No external deformity. CV: RRR, No M/G/R. No JVD. No thrill. No extra heart sounds. PULM: CTA B, no wheezes, crackles, rhonchi. No retractions. No resp. distress. No accessory muscle use. EXTR: No c/c/e NEURO Normal gait.  PSYCH: Normally interactive. Conversant. Not depressed or anxious appearing.  Calm demeanor.   Results for orders placed or performed in visit on 04/01/15  POCT Influenza A/B  Result Value Ref Range   Influenza A, POC Positive (A) Negative   Influenza B, POC Negative Negative    Assessment and Plan: Influenza due to identified novel influenza A virus with other respiratory manifestations - Plan: oseltamivir (TAMIFLU) 75 MG  capsule  Malaise - Plan: POCT Influenza A/B  Cough - Plan: POCT Influenza A/B, doxycycline (VIBRAMYCIN) 100 MG capsule  Low grade fever - Plan: POCT Influenza A/B  Treat for flu Doxy to hold in case sinus sx do not resolve when he is well from the flu  Signed Abbe AmsterdamJessica Copland, MD  b

## 2015-07-12 ENCOUNTER — Ambulatory Visit (INDEPENDENT_AMBULATORY_CARE_PROVIDER_SITE_OTHER): Payer: No Typology Code available for payment source

## 2015-07-12 ENCOUNTER — Ambulatory Visit (HOSPITAL_COMMUNITY)
Admission: EM | Admit: 2015-07-12 | Discharge: 2015-07-12 | Disposition: A | Discharge status: Home / Self Care | Payer: No Typology Code available for payment source | Attending: Emergency Medicine | Admitting: Emergency Medicine

## 2015-07-12 ENCOUNTER — Encounter (HOSPITAL_COMMUNITY): Payer: Self-pay

## 2015-07-12 DIAGNOSIS — S39012A Strain of muscle, fascia and tendon of lower back, initial encounter: Secondary | ICD-10-CM

## 2015-07-12 DIAGNOSIS — M542 Cervicalgia: Secondary | ICD-10-CM

## 2015-07-12 MED ORDER — METAXALONE 800 MG PO TABS: 800.0000 mg | ORAL_TABLET | Freq: Three times a day (TID) | ORAL | Status: AC

## 2015-07-12 MED ORDER — TRAMADOL HCL 50 MG PO TABS: ORAL_TABLET | ORAL | Status: AC

## 2015-07-12 MED ORDER — DICLOFENAC SODIUM 75 MG PO TBEC: 75.0000 mg | DELAYED_RELEASE_TABLET | Freq: Two times a day (BID) | ORAL | Status: AC

## 2015-07-12 NOTE — Discharge Instructions (Signed)
People tend to feel worse over the next several days, but most people are back to normal in 1 week. A small number of people will have persistent pain for up to six weeks. Take the diclofenac on a regular basis as directed. Drink plenty of water and take the medication with food to reduce the change of stomach irritation and to protect your kidneys. You may take up to 1 gram of tylenol 4 times a day. This with the NSAID is an extremely effective combination for pain.  Do not exceed 4 grams of tylenol per day from all sources.  ° ° Some people may require physical therapy. Early range of motion neck exercises has been shown to speed recovery. Start doing them as soon as possible. Start doing small range and amplitude movements of your neck, first in one direction, then the other. Repeat this 10 times in each direction every hour while awake. Do these to the maximum comfortable range. You may do this sitting up or lying down. °
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
## 2015-07-12 NOTE — ED Notes (Signed)
Patient discharged by Domenick GongAshley Mortenson, MD
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
## 2015-09-21 ENCOUNTER — Telehealth: Payer: Self-pay

## 2015-09-21 NOTE — Telephone Encounter (Signed)
01/14/2015 Last Tdap

## 2015-09-21 NOTE — Telephone Encounter (Signed)
Pt is needing confirmation as to if he needs to come back for a booster Tdap  Best [phone]

## 2015-09-23 NOTE — Telephone Encounter (Signed)
Pt advised.
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
